# Patient Record
Sex: Male | Born: 2000 | Race: Black or African American | Hispanic: No | Marital: Single | Smoking: Never smoker
Health system: Southern US, Community
[De-identification: ages and names within clinical notes are randomized; demographics above are authoritative.]

## PROBLEM LIST (undated history)

## (undated) DIAGNOSIS — J45909 Unspecified asthma, uncomplicated: Secondary | ICD-10-CM

---

## 2018-01-28 ENCOUNTER — Other Ambulatory Visit: Payer: Self-pay

## 2018-01-28 ENCOUNTER — Emergency Department (HOSPITAL_COMMUNITY)
Admission: EM | Admit: 2018-01-28 | Discharge: 2018-01-28 | Disposition: A | Discharge status: Home / Self Care | Payer: 59 | Attending: Emergency Medicine | Admitting: Emergency Medicine

## 2018-01-28 ENCOUNTER — Encounter (HOSPITAL_COMMUNITY): Payer: Self-pay | Admitting: *Deleted

## 2018-01-28 DIAGNOSIS — R198 Other specified symptoms and signs involving the digestive system and abdomen: Secondary | ICD-10-CM | POA: Diagnosis present

## 2018-01-28 DIAGNOSIS — J45909 Unspecified asthma, uncomplicated: Secondary | ICD-10-CM | POA: Diagnosis not present

## 2018-01-28 HISTORY — DX: Unspecified asthma, uncomplicated: J45.909

## 2018-01-28 NOTE — ED Provider Notes (Signed)
[facility] EMERGENCY DEPARTMENT Provider Note   CSN: [id number] Arrival date & time: 01/28/18  1953     History   Chief Complaint Chief Complaint  Patient presents with  . white tongue    HPI Collin Le is a 17 y.o. male who presents with concern for his tongue looking abnormal. The patient states that he thinks his tongue "looks weird" and that it has never looked like that before.  Patient states that he just noticed it this evening.  He states that he is in college and thought that maybe he got something from drinking off the cops in the cafeteria.  He denies any other concerns such as STDs.  He has no change in voice, difficulty swallowing.  He has no recent antibiotic use is.  HPI  Past Medical History:  Diagnosis Date  . Asthma     There are no active problems to display for this patient.   History reviewed. No pertinent surgical history.      Home Medications    Prior to Admission medications   Not on File    Family History History reviewed. No pertinent family history.  Social History Social History   Tobacco Use  . Smoking status: Never Smoker  . Smokeless tobacco: Never Used  Substance Use Topics  . Alcohol use: Not on file  . Drug use: Not on file     Allergies   Penicillins   Review of Systems Review of Systems Ten systems reviewed and are negative for acute change, except as noted in the HPI.    Physical Exam Updated Vital Signs BP (!) 150/74 (BP Location: Right Arm)   Pulse 86   Temp 98.7 F (37.1 C) (Oral)   Resp 16   Ht 5\' 6"  (1.676 m)   Wt 112.5 kg   SpO2 97%   BMI 40.03 kg/m   Physical Exam  Constitutional: He appears well-developed and well-nourished. No distress.  HENT:  Head: Normocephalic and atraumatic.  Mouth/Throat:    Eyes: Pupils are equal, round, and reactive to light. Conjunctivae and EOM are normal. No scleral icterus.  Neck: Normal range of motion. Neck supple.    Cardiovascular: Normal rate, regular rhythm and normal heart sounds.  Pulmonary/Chest: Effort normal and breath sounds normal. No respiratory distress.  Abdominal: Soft. There is no tenderness.  Musculoskeletal: He exhibits no edema.  Neurological: He is alert.  Skin: Skin is warm and dry. He is not diaphoretic.  Psychiatric: His behavior is normal. His mood appears anxious.  Nursing note and vitals reviewed.    ED Treatments / Results  Labs (all labs ordered are listed, but only abnormal results are displayed) Labs Reviewed - No data to display  EKG None  Radiology No results found.  Procedures Procedures (including critical care time)  Medications Ordered in ED Medications - No data to display   Initial Impression / Assessment and Plan / ED Course  I have reviewed the triage vital signs and the nursing notes.  Pertinent labs & imaging results that were available during my care of the patient were reviewed by me and considered in my medical decision making (see chart for details).     Patient with normal tongue examination.  Patient very anxious.  He does not appear to have any abnormalities, no evidence of oral thrush, stomatitis or other inflammatory or infectious issues.  No masses.  Patient appears appropriate for discharge at this time  Final Clinical Impressions(s) / ED Diagnoses  Final diagnoses:  Tongue symptom    ED Discharge Orders    None       Arthor Captain, Cordelia Poche 01/28/18 2147    Benjiman Core, MD 01/29/18 [phone]

## 2018-01-28 NOTE — ED Triage Notes (Signed)
The pt is c/o having a shite tongue  He just noticed today no pain

## 2018-01-28 NOTE — ED Provider Notes (Signed)
Patient placed in Quick Look pathway, seen and evaluated   Chief Complaint: white tongue  HPI:   Collin Le is a 17 y.o. male who presents to the ED with c/o white tongue. Patient reports he just noticed it today. Patient denies pain.  ROS: ENT: white tongue  Physical Exam:  BP (!) 156/81 (BP Location: Right Arm)   Pulse (!) 103   Temp 98.7 F (37.1 C) (Oral)   Resp 18   Ht 5\' 6"  (1.676 m)   Wt 112.5 kg   SpO2 99%   BMI 40.03 kg/m    Gen: No distress  Neuro: Awake and Alert  Skin: Warm and dry  Mouth: no coating of tongue Initiation of care has begun. The patient has been counseled on the process, plan, and necessity for staying for the completion/evaluation, and the remainder of the medical screening examination    Janne Napoleon, NP 01/28/18 2007    Derwood Kaplan, MD 01/28/18 [phone]

## 2018-01-28 NOTE — Discharge Instructions (Addendum)
Return to the er if you have any new concerns, swelling, change in voice,, or difficulty swallowing

## 2019-05-27 ENCOUNTER — Other Ambulatory Visit: Payer: Self-pay

## 2019-05-27 ENCOUNTER — Emergency Department (HOSPITAL_COMMUNITY)
Admission: EM | Admit: 2019-05-27 | Discharge: 2019-05-27 | Disposition: A | Discharge status: Home / Self Care | Payer: Medicaid Other | Attending: Emergency Medicine | Admitting: Emergency Medicine

## 2019-05-27 ENCOUNTER — Encounter (HOSPITAL_COMMUNITY): Payer: Self-pay | Admitting: Emergency Medicine

## 2019-05-27 DIAGNOSIS — J45909 Unspecified asthma, uncomplicated: Secondary | ICD-10-CM | POA: Insufficient documentation

## 2019-05-27 DIAGNOSIS — L089 Local infection of the skin and subcutaneous tissue, unspecified: Secondary | ICD-10-CM | POA: Diagnosis not present

## 2019-05-27 DIAGNOSIS — L910 Hypertrophic scar: Secondary | ICD-10-CM | POA: Diagnosis present

## 2019-05-27 MED ORDER — DOXYCYCLINE HYCLATE 100 MG PO CAPS: 100.0000 mg | ORAL_CAPSULE | Freq: Two times a day (BID) | ORAL | 0 refills | Status: AC

## 2019-05-27 NOTE — Discharge Instructions (Addendum)
Please complete the entire course of antibiotics even if your symptoms improve prevent worsening or recurrence of your infection. Return to the ED if you start to experience worsening drainage, swelling, pain behind her ear, redness or fever.

## 2019-05-27 NOTE — ED Triage Notes (Signed)
Pt reports drainage to keloid on R ear since yesterday.

## 2019-05-27 NOTE — ED Provider Notes (Signed)
[place] EMERGENCY DEPARTMENT Provider Note   CSN: [id number] Arrival date & time: 05/27/19  1532     History Chief Complaint  Patient presents with  . keloid drainage    Collin Le is a 19 y.o. male who presents to the ED with a chief complaint of right ear drainage.  Since yesterday noted some purulent drainage near the site of his keloid in his right earlobe.  He is concerned that there could be an infection there.  He denies any drainage from the ear canal, trauma to the area, swelling or pain behind the ear, changes to hearing or fevers.  HPI     Past Medical History:  Diagnosis Date  . Asthma     There are no problems to display for this patient.   History reviewed. No pertinent surgical history.     No family history on file.  Social History   Tobacco Use  . Smoking status: Never Smoker  . Smokeless tobacco: Never Used  Substance Use Topics  . Alcohol use: Not on file  . Drug use: Not on file    Home Medications Prior to Admission medications   Medication Sig Start Date End Date Taking? Authorizing Provider  doxycycline (VIBRAMYCIN) 100 MG capsule Take 1 capsule (100 mg total) by mouth 2 (two) times daily for 7 days. 05/27/19 06/03/19  Harsha Yusko, Nicanor Alcon, PA-C    Allergies    Penicillins  Review of Systems   Review of Systems  Constitutional: Positive for chills. Negative for fever.  HENT: Negative for ear discharge, ear pain, facial swelling and hearing loss.   Skin: Positive for wound.    Physical Exam Updated Vital Signs BP (!) 145/84 (BP Location: Left Arm)   Pulse 94   Temp 98.5 F (36.9 C) (Oral)   Resp 17   SpO2 100%   Physical Exam Vitals and nursing note reviewed.  Constitutional:      General: He is not in acute distress.    Appearance: He is well-developed. He is not diaphoretic.  HENT:     Head: Normocephalic and atraumatic.     Right Ear: No mastoid tenderness. No hemotympanum. Tympanic membrane is  not perforated or erythematous.     Ears:      Comments: Small circular keloid on the right earlobe without active drainage or tenderness. TM without abnormalities or signs of infection.  No tenderness palpation of the auricle or tragus.  No drainage from the [facility]. Eyes:     General: No scleral icterus.    Conjunctiva/sclera: Conjunctivae normal.  Pulmonary:     Effort: Pulmonary effort is normal. No respiratory distress.  Musculoskeletal:     Cervical back: Normal range of motion.  Skin:    Findings: No rash.  Neurological:     Mental Status: He is alert.     ED Results / Procedures / Treatments   Labs (all labs ordered are listed, but only abnormal results are displayed) Labs Reviewed - No data to display  EKG None  Radiology No results found.  Procedures Procedures (including critical care time)  Medications Ordered in ED Medications - No data to display  ED Course  I have reviewed the triage vital signs and the nursing notes.  Pertinent labs & imaging results that were available during my care of the patient were reviewed by me and considered in my medical decision making (see chart for details).    MDM Rules/Calculators/A&P  19 year old male presenting to the ED for drainage from the right ear keloid.  Noticed drainage yesterday.  No drainage or pain noted on my examination.  He is afebrile.  Will treat with antibiotics and PCP follow-up.  No signs of mastoiditis, otitis externa or other infectious cause of symptoms.  Patient is hemodynamically stable, in NAD, and able to ambulate in the ED. Evaluation does not show pathology that would require ongoing emergent intervention or inpatient treatment. I explained the diagnosis to the patient. Pain has been managed and has no complaints prior to discharge. Patient is comfortable with above plan and is stable for discharge at this time. All questions were answered prior to disposition. Strict return  precautions for returning to the ED were discussed. Encouraged follow up with PCP.   An After Visit Summary was printed and given to the patient.   Portions of this note were generated with Scientist, clinical (histocompatibility and immunogenetics). Dictation errors may occur despite best attempts at proofreading.  Final Clinical Impression(s) / ED Diagnoses Final diagnoses:  Skin infection  Keloid scar of skin    Rx / DC Orders ED Discharge Orders         Ordered    doxycycline (VIBRAMYCIN) 100 MG capsule  2 times daily     05/27/19 1637           Dietrich Pates, PA-C 05/27/19 1637    Cathren Laine, MD 05/27/19 2241

## 2019-06-18 ENCOUNTER — Emergency Department (HOSPITAL_COMMUNITY)
Admission: EM | Admit: 2019-06-18 | Discharge: 2019-06-18 | Disposition: A | Discharge status: Home / Self Care | Payer: Medicaid Other | Attending: Emergency Medicine | Admitting: Emergency Medicine

## 2019-06-18 ENCOUNTER — Encounter (HOSPITAL_COMMUNITY): Payer: Self-pay | Admitting: *Deleted

## 2019-06-18 ENCOUNTER — Other Ambulatory Visit: Payer: Self-pay

## 2019-06-18 ENCOUNTER — Emergency Department (HOSPITAL_COMMUNITY): Payer: Medicaid Other

## 2019-06-18 DIAGNOSIS — R0789 Other chest pain: Secondary | ICD-10-CM | POA: Insufficient documentation

## 2019-06-18 DIAGNOSIS — E876 Hypokalemia: Secondary | ICD-10-CM | POA: Diagnosis not present

## 2019-06-18 DIAGNOSIS — J45909 Unspecified asthma, uncomplicated: Secondary | ICD-10-CM | POA: Diagnosis not present

## 2019-06-18 LAB — CBC WITH DIFFERENTIAL/PLATELET
Abs Immature Granulocytes: 0.02 10*3/uL (ref 0.00–0.07)
Basophils Absolute: 0.1 10*3/uL (ref 0.0–0.1)
Basophils Relative: 1 %
Eosinophils Absolute: 0.2 10*3/uL (ref 0.0–0.5)
Eosinophils Relative: 4 %
HCT: 51.7 % (ref 39.0–52.0)
Hemoglobin: 17.5 g/dL — ABNORMAL HIGH (ref 13.0–17.0)
Immature Granulocytes: 0 %
Lymphocytes Relative: 59 %
Lymphs Abs: 3.1 10*3/uL (ref 0.7–4.0)
MCH: 27.6 pg (ref 26.0–34.0)
MCHC: 33.8 g/dL (ref 30.0–36.0)
MCV: 81.4 fL (ref 80.0–100.0)
Monocytes Absolute: 0.4 10*3/uL (ref 0.1–1.0)
Monocytes Relative: 8 %
Neutro Abs: 1.5 10*3/uL — ABNORMAL LOW (ref 1.7–7.7)
Neutrophils Relative %: 28 %
Platelets: 261 10*3/uL (ref 150–400)
RBC: 6.35 MIL/uL — ABNORMAL HIGH (ref 4.22–5.81)
RDW: 12 % (ref 11.5–15.5)
WBC: 5.3 10*3/uL (ref 4.0–10.5)
nRBC: 0 % (ref 0.0–0.2)

## 2019-06-18 LAB — BASIC METABOLIC PANEL
Anion gap: 12 (ref 5–15)
BUN: 7 mg/dL (ref 6–20)
CO2: 24 mmol/L (ref 22–32)
Calcium: 10.3 mg/dL (ref 8.9–10.3)
Chloride: 104 mmol/L (ref 98–111)
Creatinine, Ser: 1.01 mg/dL (ref 0.61–1.24)
GFR calc Af Amer: >60 mL/min (ref >60)
GFR calc non Af Amer: >60 mL/min (ref >60)
Glucose, Bld: 105 mg/dL — ABNORMAL HIGH (ref 70–99)
Potassium: 3.3 mmol/L — ABNORMAL LOW (ref 3.5–5.1)
Sodium: 140 mmol/L (ref 135–145)

## 2019-06-18 LAB — TROPONIN I (HIGH SENSITIVITY): Troponin I (High Sensitivity): 6 ng/L (ref <18)

## 2019-06-18 MED ORDER — POTASSIUM CHLORIDE CRYS ER 20 MEQ PO TBCR
40.0000 meq | EXTENDED_RELEASE_TABLET | Freq: Once | ORAL | Status: AC
  Administered 2019-06-18: 05:00:00 40 meq via ORAL
  Filled 2019-06-18: qty 2

## 2019-06-18 MED ORDER — IBUPROFEN 400 MG PO TABS
400.0000 mg | ORAL_TABLET | Freq: Once | ORAL | Status: AC
  Administered 2019-06-18: 04:00:00 400 mg via ORAL
  Filled 2019-06-18: qty 1

## 2019-06-18 MED ORDER — ASPIRIN 81 MG PO CHEW
324.0000 mg | CHEWABLE_TABLET | Freq: Once | ORAL | Status: AC
  Administered 2019-06-18: 324 mg via ORAL
  Filled 2019-06-18: qty 4

## 2019-06-18 NOTE — ED Notes (Signed)
Pt verbalized understanding of d/c instructions, follow up care and s/s requiring return to Ed. Pt had no additional questions at this time.

## 2019-06-18 NOTE — ED Triage Notes (Signed)
Thew pt is c/o a 15 minute episode of lt upper chest pain while playing video games.  He called [employer] and came right over.  No previous history  Denies drug use

## 2019-06-18 NOTE — ED Triage Notes (Signed)
No chest pai since then

## 2019-06-18 NOTE — ED Provider Notes (Signed)
[facility] EMERGENCY DEPARTMENT Provider Note   CSN: [id number] Arrival date & time: 06/18/19  0015   History Chief Complaint  Patient presents with  . Chest Pain    Collin Le is a 19 y.o. male.  The history is provided by the patient.  Chest Pain He was playing some video games when he noticed a tight, pressure feeling in the left upper anterior chest.  He rated it at 6/10.  There is no associated dyspnea, nausea, diaphoresis.  It tended to wax and wane, and now is only a very vague discomfort.  He does have history of anxiety but does not have pain like this before.  He is a non-smoker and denies drug use.  He denies history of diabetes, hypertension, hyperlipidemia but does state that he was told he was prediabetic.  There is no known family history of premature coronary atherosclerosis.  Past Medical History:  Diagnosis Date  . Asthma     There are no problems to display for this patient.   History reviewed. No pertinent surgical history.     No family history on file.  Social History   Tobacco Use  . Smoking status: Never Smoker  . Smokeless tobacco: Never Used  Substance Use Topics  . Alcohol use: Never  . Drug use: Never    Home Medications Prior to Admission medications   Not on File    Allergies    Penicillins  Review of Systems   Review of Systems  Cardiovascular: Positive for chest pain.  All other systems reviewed and are negative.   Physical Exam Updated Vital Signs BP 130/75 (BP Location: Left Arm)   Pulse 73   Temp 98.9 F (37.2 C) (Oral)   Resp 16   Ht 5\' 6"  (1.676 m)   Wt 113.4 kg   SpO2 99%   BMI 40.35 kg/m   Physical Exam Vitals and nursing note reviewed.   19 year old male, resting comfortably and in no acute distress. Vital signs are normal. Oxygen saturation is 99%, which is normal. Head is normocephalic and atraumatic. PERRLA, EOMI. Oropharynx is clear. Neck is nontender and supple without  adenopathy or JVD. Back is nontender and there is no CVA tenderness. Lungs are clear without rales, wheezes, or rhonchi. Chest is mildly tender in the left anterior chest wall without crepitus. Heart has regular rate and rhythm without murmur. Abdomen is soft, flat, nontender without masses or hepatosplenomegaly and peristalsis is normoactive. Extremities have no cyanosis or edema, full range of motion is present. Skin is warm and dry without rash. Neurologic: Mental status is normal, cranial nerves are intact, there are no motor or sensory deficits.  ED Results / Procedures / Treatments   Labs (all labs ordered are listed, but only abnormal results are displayed) Labs Reviewed  BASIC METABOLIC PANEL - Abnormal; Notable for the following components:      Result Value   Potassium 3.3 (*)    Glucose, Bld 105 (*)    All other components within normal limits  CBC WITH DIFFERENTIAL/PLATELET - Abnormal; Notable for the following components:   RBC 6.35 (*)    Hemoglobin 17.5 (*)    Neutro Abs 1.5 (*)    All other components within normal limits  TROPONIN I (HIGH SENSITIVITY)  TROPONIN I (HIGH SENSITIVITY)    EKG EKG Interpretation  Date/Time:  Sunday June 18 2019 00:17:14 EST Ventricular Rate:  85 PR Interval:  166 QRS Duration: 84 QT Interval:  342  QTC Calculation: 406 R Axis:   55 Text Interpretation: Normal sinus rhythm Normal ECG No old tracing to compare Confirmed by Delora Fuel ([id number]) on 06/18/2019 12:35:22 AM   Radiology DG Chest Port 1 View  Result Date: 06/18/2019 CLINICAL DATA:  Chest pain EXAM: PORTABLE CHEST 1 VIEW COMPARISON:  None. FINDINGS: Lungs are clear.  No pleural effusion or pneumothorax. The heart is normal in size. IMPRESSION: No evidence of acute cardiopulmonary disease. Electronically Signed   By: Julian Hy M.D.   On: 06/18/2019 03:40    Procedures Procedures   Medications Ordered in ED Medications  aspirin chewable tablet 324 mg (has  no administration in time range)  ibuprofen (ADVIL) tablet 400 mg (has no administration in time range)    ED Course  I have reviewed the triage vital signs and the nursing notes.  Pertinent labs & imaging results that were available during my care of the patient were reviewed by me and considered in my medical decision making (see chart for details).  MDM Rules/Calculators/A&P Chest pain which is probably musculoskeletal.  Low index of suspicion for ACS, pulmonary embolism.  ECG is normal.  Will check chest x-ray and screening labs.  Chest x-ray is normal.  Labs are significant only for mild hypokalemia and is given a dose of oral potassium.  Patient is felt to be safe for discharge.  He is reassured of the benign results of his work-up.  Final Clinical Impression(s) / ED Diagnoses Final diagnoses:  Atypical chest pain  Hypokalemia    Rx / DC Orders ED Discharge Orders    None       Delora Fuel, MD 41/93/79 [phone]

## 2019-11-23 ENCOUNTER — Emergency Department (HOSPITAL_COMMUNITY)
Admission: EM | Admit: 2019-11-23 | Discharge: 2019-11-23 | Disposition: A | Discharge status: Home / Self Care | Payer: Medicaid Other | Attending: Emergency Medicine | Admitting: Emergency Medicine

## 2019-11-23 DIAGNOSIS — R590 Localized enlarged lymph nodes: Secondary | ICD-10-CM | POA: Insufficient documentation

## 2019-11-23 DIAGNOSIS — R05 Cough: Secondary | ICD-10-CM | POA: Diagnosis present

## 2019-11-23 DIAGNOSIS — J45909 Unspecified asthma, uncomplicated: Secondary | ICD-10-CM | POA: Diagnosis not present

## 2019-11-23 NOTE — ED Triage Notes (Addendum)
Pt sts his lymph nodes in his neck "are swollen and it is annoying and they hurt." When they first became swollen (the beginning of June) pt was sick, but all symptoms have resolved. When pt is asked where the pain/swelling is, he points to the base of his neck, close to his clavicle.

## 2019-11-23 NOTE — Discharge Instructions (Signed)
You are seen today for swollen lymph nodes, I want you to follow-up with your primary care in the next week if your swollen lymph nodes are not gone down.  Your blood pressure was also elevated in the emergency department today, I also want you to follow-up with your primary care about this.  If you start having any new or worsening concerning symptoms please come back to the emergency department.  Use the attached instructions.

## 2019-11-23 NOTE — ED Provider Notes (Addendum)
[facility] EMERGENCY DEPARTMENT Provider Note   CSN: [id number] Arrival date & time: 11/23/19  1821     History No chief complaint on file.   Collin Le is a 19 y.o. male with pertinent past medical history of asthma that presents the emergency department today for swollen lymph nodes.  Patient states that 3 weeks ago he came down with a cold, had sore throat, cough, congestion which lasted a couple days and then started having lymph nodes on his right neck, states that the lymph nodes have not gone away and have persisted for 3 weeks now.  Denies any of his URI-like symptoms currently.  States that cough sore throat and congestion have resolved.  States that he has never been tested for Covid or had Covid before, has not had the Covid vaccines.  Denies any chest pain, shortness of breath, fevers, chills, night sweats, weight changes.  Denies any family history of cancer.  Has never had anything like this before.  States that he is pretty healthy, does not have diabetes, is not immunocompromise.  Does have primary care in [place].  Has not been taking anything for this.  No relieving factors. HPI     Past Medical History:  Diagnosis Date   Asthma     There are no problems to display for this patient.   No past surgical history on file.     No family history on file.  Social History   Tobacco Use   Smoking status: Never Smoker   Smokeless tobacco: Never Used  Substance Use Topics   Alcohol use: Never   Drug use: Never    Home Medications Prior to Admission medications   Not on File    Allergies    Penicillins  Review of Systems   Review of Systems  Constitutional: Negative for chills, diaphoresis, fatigue and fever.  HENT: Negative for congestion, sore throat and trouble swallowing.   Eyes: Negative for pain and visual disturbance.  Respiratory: Negative for cough, shortness of breath and wheezing.   Cardiovascular: Negative for  chest pain, palpitations and leg swelling.  Gastrointestinal: Negative for abdominal distention, abdominal pain, diarrhea, nausea and vomiting.  Genitourinary: Negative for difficulty urinating.  Musculoskeletal: Negative for back pain, neck pain and neck stiffness.  Skin: Negative for pallor.  Neurological: Negative for dizziness, speech difficulty, weakness and headaches.  Psychiatric/Behavioral: Negative for confusion.    Physical Exam Updated Vital Signs BP (!) 161/91    Pulse 90    Temp 99.5 F (37.5 C) (Oral)    Resp 16    Ht 5\' 6"  (1.676 m)    Wt (!) 99.8 kg    SpO2 100%    BMI 35.51 kg/m   Physical Exam Constitutional:      General: He is not in acute distress.    Appearance: Normal appearance. He is not ill-appearing, toxic-appearing or diaphoretic.  HENT:     Mouth/Throat:     Mouth: Mucous membranes are moist.     Pharynx: Oropharynx is clear.  Eyes:     General: No scleral icterus.    Extraocular Movements: Extraocular movements intact.     Pupils: Pupils are equal, round, and reactive to light.  Cardiovascular:     Rate and Rhythm: Normal rate and regular rhythm.     Pulses: Normal pulses.     Heart sounds: Normal heart sounds.  Pulmonary:     Effort: Pulmonary effort is normal. No respiratory distress.  Breath sounds: Normal breath sounds. No stridor. No wheezing, rhonchi or rales.  Chest:     Chest wall: No tenderness.  Abdominal:     General: Abdomen is flat. There is no distension.     Palpations: Abdomen is soft.     Tenderness: There is no abdominal tenderness. There is no guarding or rebound.  Musculoskeletal:        General: No swelling or tenderness. Normal range of motion.     Cervical back: Normal range of motion and neck supple. No rigidity.     Right lower leg: No edema.     Left lower leg: No edema.  Lymphadenopathy:     Cervical: Cervical adenopathy (Patient does have smaller than marble sized  R anterior cervical lymphadenopathy about 3  llymph nodes in this distribution that are all extremely tender to touch and mobile.  No other lymphadenopathy noted) present.  Skin:    General: Skin is warm and dry.     Capillary Refill: Capillary refill takes less than 2 seconds.     Coloration: Skin is not pale.     Comments: No lymphadenopathy noted on no lymphadenopathy noted on sternum, axilla, clavicles, pre or posterior auricles, sublingual.  Neurological:     General: No focal deficit present.     Mental Status: He is alert and oriented to person, place, and time.  Psychiatric:        Mood and Affect: Mood normal.        Behavior: Behavior normal.     ED Results / Procedures / Treatments   Labs (all labs ordered are listed, but only abnormal results are displayed) Labs Reviewed - No data to display  EKG None  Radiology No results found.  Procedures Procedures (including critical care time)  Medications Ordered in ED Medications - No data to display  ED Course  I have reviewed the triage vital signs and the nursing notes.  Pertinent labs & imaging results that were available during my care of the patient were reviewed by me and considered in my medical decision making (see chart for details).    MDM Rules/Calculators/A&P                         Collin Le is a 20 y.o. male with pertinent past medical history of asthma that presents the emergency department today for swollen lymph nodes.  Lymphadenopathy is not concerning, 3  lymph nodes in cervical anterior neck on right side, they are all tender and mobile.  Do not think that blood work is necessary at this time or imaging.  Patient has no list B symptoms.  Patient does have primary care in [place], I told him that if they do persist for another week then he does need to see his primary care.  Patient is agreeable and states that he will do this.  Did discuss elevated blood pressure with patient in dispo, and need to see PCP for this.  No chest pain or  shortness breath.  Doubt need for further emergent work up at this time. I explained the diagnosis and have given explicit precautions to return to the ER including for any other new or worsening symptoms. The patient understands and accepts the medical plan as it's been dictated and I have answered their questions. Discharge instructions concerning home care and prescriptions have been given. The patient is STABLE and is discharged to home in good condition.   Final Clinical Impression(s) /  ED Diagnoses Final diagnoses:  Lymphadenopathy, cervical    Rx / DC Orders ED Discharge Orders    None         Farrel Gordon, Cordelia Poche 11/23/19 2007    Eber Hong, MD 11/23/19 2150

## 2019-12-12 ENCOUNTER — Ambulatory Visit: Payer: Medicaid Other | Attending: Internal Medicine

## 2019-12-12 DIAGNOSIS — Z23 Encounter for immunization: Secondary | ICD-10-CM

## 2019-12-12 NOTE — Progress Notes (Signed)
   Covid-19 Vaccination Clinic  Name:  Azarius Lambson    MRN: [id number] DOB: [birth year]  12/12/2019  Mr. Peckenpaugh was observed post Covid-19 immunization for 15 minutes without incident. He was provided with Vaccine Information Sheet and instruction to access the V-Safe system.   Mr. Dority was instructed to call 911 with any severe reactions post vaccine: Marland Kitchen Difficulty breathing  . Swelling of face and throat  . A fast heartbeat  . A bad rash all over body  . Dizziness and weakness   Immunizations Administered    Name Date Dose VIS Date Route   Pfizer COVID-19 Vaccine 12/12/2019  1:49 PM 0.3 mL 06/28/2018 Intramuscular   Manufacturer: [employer], [employer]   Lot: [id number]   NDC: [address]-7

## 2020-01-02 ENCOUNTER — Ambulatory Visit: Payer: Medicaid Other | Attending: Internal Medicine

## 2020-01-02 DIAGNOSIS — Z23 Encounter for immunization: Secondary | ICD-10-CM

## 2020-01-02 NOTE — Progress Notes (Signed)
   Covid-19 Vaccination Clinic  Name:  Kannon Granderson    MRN: [id number] DOB: Sep 16, 2000  01/02/2020  Mr. Lundberg was observed post Covid-19 immunization for 15 minutes without incident. He was provided with Vaccine Information Sheet and instruction to access the V-Safe system.   Mr. Fisch was instructed to call 911 with any severe reactions post vaccine: Marland Kitchen Difficulty breathing  . Swelling of face and throat  . A fast heartbeat  . A bad rash all over body  . Dizziness and weakness   Immunizations Administered    Name Date Dose VIS Date Route   Pfizer COVID-19 Vaccine 01/02/2020  3:15 PM 0.3 mL 06/28/2018 Intramuscular   Manufacturer: [employer], [employer]   Lot: [id number]   NDC: [address]-4

## 2020-05-01 IMAGING — DX DG CHEST 1V PORT
1 series · 1 of 1 positions shown · non-contrast
Comparison: None.

CLINICAL DATA: Chest pain

EXAM:
PORTABLE CHEST 1 VIEW

[chest ap]
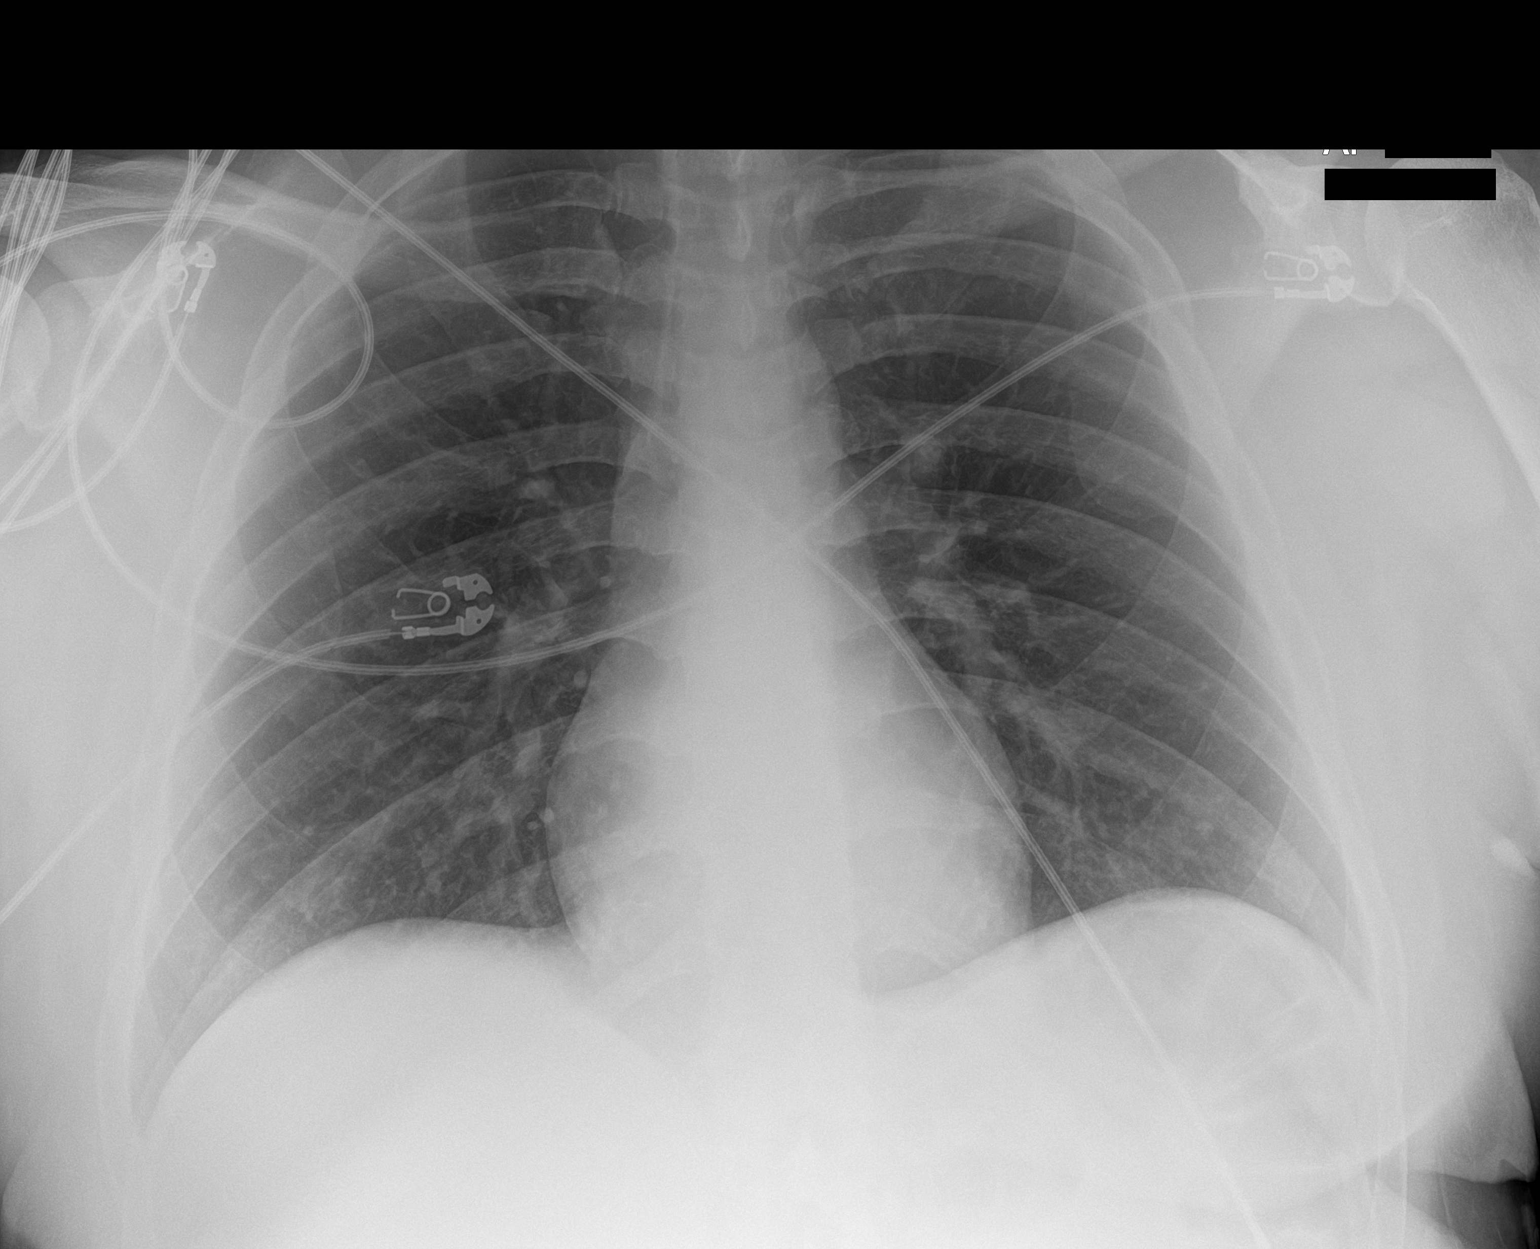

[1 of 1 positions shown; findings below may reference images not displayed]

FINDINGS: Lungs are clear.  No pleural effusion or pneumothorax.

The heart is normal in size.
IMPRESSION: No evidence of acute cardiopulmonary disease.
